# Patient Record
Sex: Male | Born: 1990 | Race: Black or African American | Hispanic: No | Marital: Single | Smoking: Never smoker
Health system: Southern US, Community
[De-identification: ages and names within clinical notes are randomized; demographics above are authoritative.]

## PROBLEM LIST (undated history)

## (undated) DIAGNOSIS — S62609A Fracture of unspecified phalanx of unspecified finger, initial encounter for closed fracture: Secondary | ICD-10-CM

---
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
## 2009-06-03 ENCOUNTER — Emergency Department (HOSPITAL_COMMUNITY): Admission: EM | Admit: 2009-06-03 | Discharge: 2009-06-03 | Payer: Self-pay | Admitting: Emergency Medicine

## 2010-11-26 NOTE — Consult Note (Signed)
[name], YIELDING              ACCOUNT NO.:  [id number]   MEDICAL RECORD NO.:  [id number]          PATIENT TYPE:  EMS   LOCATION:  MAJO                         FACILITY:  [facility]   PHYSICIAN:  Vanita Panda. Magnus Ivan, M.D.DATE OF BIRTH:  06-18-1991   DATE OF CONSULTATION:  12/10/2006  DATE OF DISCHARGE:  12/10/2006                                 CONSULTATION   REASON FOR CONSULTATION:  Right thumb, open fracture dislocation.   HISTORY OF PRESENT ILLNESS:  Briefly, Darrell Dougherty is a 20 year old right-  hand-dominant male who actually slammed his right dominant thumb into a  locker today.  He had obvious injury to his thumb.  He was brought by  his family members to the pediatric ER at [facility].  He is found to  have an open fracture dislocation of the right thumb at the IP joint,  and orthopedic surgery was consulted.  He denied any numbness and  tingling in the tip of the thumb, and otherwise was doing well.   PAST MEDICAL HISTORY:  Negative.   ALLERGIES:  NO KNOWN DRUG ALLERGIES.   MEDICATIONS:  None.   SOCIAL HISTORY:  He is a high [employer] and is right-hand dominant.  He has had a previous injury to his little finger.   REVIEW OF SYSTEMS:  Negative for chest pain, short of breath, fever,  chills, nausea, vomiting.   PHYSICAL EXAM:  VITAL SIGNS:  He is afebrile.  Stable vital signs.  GENERAL:  Alert and oriented male in no acute distress but obvious  discomfort.  RIGHT HAND:  Shows his thumb with obvious deformity of the IP joint,  with an open exposed proximal phalanx on the volar radial aspect.  His  thumb is dislocated at the IP joint dorsally and ulnarly.  The tip is a  well-perfused, and according to the staff, it had normal sensation prior  to them performing a digital block.  X-rays do show a right thumb  fracture dislocation of the IP joint in a dorsal/ulnar position, with  fracture of the proximal phalanx at the joint on the ulnar aspect.   IMPRESSION:   This is a 20 year old right-hand-dominant male with a right  thumb interphalangeal joint fracture dislocation.   PLAN:  In the emergency room, I was able to supplement the block with a  plain Marcaine digital block.  I cleaned the wound thoroughly with a  Betadine and normal saline solution, lavaging it through the open wound.  He was given a dose of IV Ancef.  Closed reduction with manipulation and  splinting was performed.  Post-reduction films showed completely reduced  position of the thumb.  The tip remain well-perfused.  He is going to be  discharged on p.o. antibiotics, as well as pain medication, and surgery  will be set up for pinning of the thumb IP joint and the  proximal phalanx fracture, during regular outpatient daytime hours next  week.  The risks and benefits:  There has been an understanding by him,  and I have talked to his mom on the phone, who understands this as well,  and  we will proceed with that next week.      Vanita Panda. Magnus Ivan, M.D.  Electronically Signed     CYB/MEDQ  D:  12/10/2006  T:  12/10/2006  Job:  [id number]

## 2010-11-26 NOTE — Op Note (Signed)
Darrell Dougherty, Darrell Dougherty              ACCOUNT NO.:  [id number]   MEDICAL RECORD NO.:  [id number]          PATIENT TYPE:  AMB   LOCATION:  SDS                          FACILITY:  [facility]   PHYSICIAN:  Vanita Panda. Magnus Ivan, M.D.DATE OF BIRTH:  [birth year]   DATE OF PROCEDURE:  12/15/2006  DATE OF DISCHARGE:  12/15/2006                               OPERATIVE REPORT   PREOPERATIVE DIAGNOSIS:  Right thumb interphalangeal joint fracture-  dislocation with proximal phalanx fracture.   POSTOPERATIVE DIAGNOSIS:  Right thumb interphalangeal joint fracture-  dislocation with proximal phalanx fracture.   PROCEDURE:  Closed reduction and percutaneous pinning of right thumb PIP  joint fracture dislocation.   SURGEON:  Doneen Poisson, MD   ANESTHESIA:  General.   ANTIBIOTICS:  One gram of IV Ancef.   BLOOD LOSS:  Minimal.   COMPLICATIONS:  None.   INDICATIONS:  Briefly, Darrell Dougherty is a 20 year old right-hand dominant male  who last Thursday sustained an open fracture-dislocation of his right  thumb at the PIP joint.  There was a proximal phalanx fracture of the  ulnar condyle of the proximal phalanx.  The open part was radial.  I saw  him in the emergency room, washed the wound well, did a digital block,  and was able to reduce the thumb into near anatomic position.  I placed  this in a splint and then set him up for definitive treatment for today.  The risks, benefits of surgery have been explained to him and his mother  and were well understood and they agreed to with surgery.   PROCEDURE:  After informed consent was obtained, the appropriate right  hand was marked.  He was brought to the operating room, placed supine on  the operating table.  General anesthesia was obtained.  His right hand  was prepped with Betadine scrub and then DuraPrep.  Using the  fluoroscopic C-arm with its base at the table, I was able to see the  thumb in its alignment.  There was certainly an unstable  aspect to the  fracture and the joint.  I took a 0.035 K-wire and ran it in a  retrograde fashion from the tip of the distal phalanx crossing the thumb  IP joint to hold this in a reduced position.  I next did a separate  0.035 K-wire in an oblique direction capturing the fracture piece in an  anatomic position to hold the condyle fracture into place.  The pins  were cut outside the skin and pin caps were placed.  The small open  wound from previously was cleaned again.  Xeroform was placed back over  this.  The thumb remained perfused throughout the case.   Following this, a well-padded sterile dressing was applied and pin caps  were placed.  I placed a well-padded plaster splint over the pin caps  for protection.  The patient was awakened, extubated, and taken to the  recovery room in stable condition.  Postoperatively, he will follow up  in the next three to five days and I will change his dressing and splint  and then likely the pins  in three to four weeks before removing these to  begin range of motion of the thumb.      Vanita Panda. Magnus Ivan, M.D.  Electronically Signed     CYB/MEDQ  D:  12/15/2006  T:  12/16/2006  Job:  [id number]

## 2011-05-01 LAB — CBC
HCT: 45.5
Hemoglobin: 14.4
MCHC: 31.7
RDW: 14.3 — ABNORMAL HIGH

## 2012-05-14 DEATH — deceased

## 2012-06-08 ENCOUNTER — Emergency Department (HOSPITAL_COMMUNITY): Payer: Self-pay

## 2012-06-08 ENCOUNTER — Encounter (HOSPITAL_COMMUNITY): Payer: Self-pay | Admitting: Emergency Medicine

## 2012-06-08 ENCOUNTER — Emergency Department (HOSPITAL_COMMUNITY)
Admission: EM | Admit: 2012-06-08 | Discharge: 2012-06-08 | Disposition: A | Discharge status: Home / Self Care | Payer: Self-pay | Attending: Emergency Medicine | Admitting: Emergency Medicine

## 2012-06-08 DIAGNOSIS — Y9389 Activity, other specified: Secondary | ICD-10-CM | POA: Insufficient documentation

## 2012-06-08 DIAGNOSIS — S62339A Displaced fracture of neck of unspecified metacarpal bone, initial encounter for closed fracture: Secondary | ICD-10-CM | POA: Insufficient documentation

## 2012-06-08 DIAGNOSIS — Y929 Unspecified place or not applicable: Secondary | ICD-10-CM | POA: Insufficient documentation

## 2012-06-08 DIAGNOSIS — IMO0002 Reserved for concepts with insufficient information to code with codable children: Secondary | ICD-10-CM | POA: Insufficient documentation

## 2012-06-08 MED ORDER — HYDROCODONE-ACETAMINOPHEN 5-325 MG PO TABS
1.0000 | ORAL_TABLET | Freq: Once | ORAL | Status: AC
  Administered 2012-06-08: 1 via ORAL
  Filled 2012-06-08: qty 1

## 2012-06-08 MED ORDER — OXYCODONE-ACETAMINOPHEN 5-325 MG PO TABS: 1.0000 | ORAL_TABLET | ORAL | Status: DC | PRN

## 2012-06-08 NOTE — Progress Notes (Signed)
Orthopedic Tech Progress Note Patient Details:  Darrell Dougherty 25-May-1991 [id number]  Ortho Devices Type of Ortho Device: Ulna gutter splint Ortho Device/Splint Location: LEFT UNNA GUTTER  SPLINT Ortho Device/Splint Interventions: Application   Cammer, Mickie Bail 06/08/2012, 3:48 PM

## 2012-06-08 NOTE — ED Notes (Signed)
Pt c/o left pinky injury after punching glass yesterday; bruising noted

## 2012-06-08 NOTE — ED Notes (Signed)
Pt given ice to apply to site.

## 2012-06-08 NOTE — ED Notes (Signed)
Pt c/o 9/10 throbbing pain to left hand, states he cannot move pinky finger to left hand. Pt A&Ox4, ambulatory, nad.

## 2012-06-08 NOTE — ED Provider Notes (Signed)
History  Scribed for Carleene Cooper III, MD, the patient was seen in room TR05C/TR05C. This chart was scribed by Candelaria Stagers. The patient's care started at 1:26 PM   CSN: [id number]  Arrival date & time 06/08/12  1217   First MD Initiated Contact with Patient 06/08/12 1239      Chief Complaint  Patient presents with  . Finger Injury     The history is provided by the patient. No language interpreter was used.   Darrell Dougherty is a 21 y.o. male who presents to the Emergency Department complaining of pain to his left pinky after punching glass earlier today.  He has no other injuries.  Bending his finger makes the pain worse.  He has taken nothing for the pain.   History reviewed. No pertinent past medical history.  History reviewed. No pertinent past surgical history.  History reviewed. No pertinent family history.  History  Substance Use Topics  . Smoking status: Never Smoker   . Smokeless tobacco: Not on file  . Alcohol Use: Yes      Review of Systems  All other systems reviewed and are negative.    Allergies  Review of patient's allergies indicates no known allergies.  Home Medications  No current outpatient prescriptions on file.  BP 128/70  Pulse 77  Temp 98 F (36.7 C) (Oral)  Resp 18  SpO2 100%  Physical Exam  Nursing note and vitals reviewed. Constitutional: He is oriented to person, place, and time. He appears well-developed and well-nourished. No distress.  HENT:  Head: Normocephalic and atraumatic.  Eyes: EOM are normal.  Neck: Neck supple. No tracheal deviation present.  Cardiovascular: Normal rate.   Pulmonary/Chest: Effort normal. No respiratory distress.  Musculoskeletal: Normal range of motion.       Swelling over the distal left 5th metatarsal.  Intact sensation and tendon function.  No skin injury.      Neurological: He is alert and oriented to person, place, and time.  Skin: Skin is warm and dry.  Psychiatric: He has a normal  mood and affect. His behavior is normal.    ED Course  Procedures  DIAGNOSTIC STUDIES: Oxygen Saturation is 100% on room air, normal by my interpretation.    COORDINATION OF CARE:   13:31 Ordered: DG Hand Complete Left  Labs Reviewed - No data to display Dg Hand Complete Left  06/08/2012  *RADIOLOGY REPORT*  Clinical Data: Finger injury  LEFT HAND - COMPLETE 3+ VIEW  Comparison: None.  Findings: Three views of the left hand submitted.  There is displaced fracture distal aspect left fifth metacarpal.  IMPRESSION: Displaced fracture of distal aspect left fifth metacarpal.   Original Report Authenticated By: Natasha Mead, M.D.     3:05 PM Call to Dr. Dionicio Stall office.  Pt is to call in to the office this afternoon to get a time to be seen tomorrow.  Rx Percocet for pain, ulnar gutter splint to left hand.  1. Boxers fracture     I personally performed the services described in this documentation, which was scribed in my presence. The recorded information has been reviewed and is accurate. Osvaldo Human, MD      Carleene Cooper III, MD 06/08/12 [phone]

## 2012-06-09 ENCOUNTER — Other Ambulatory Visit: Payer: Self-pay | Admitting: Orthopedic Surgery

## 2012-06-09 ENCOUNTER — Encounter (HOSPITAL_BASED_OUTPATIENT_CLINIC_OR_DEPARTMENT_OTHER): Payer: Self-pay | Admitting: *Deleted

## 2012-06-13 DIAGNOSIS — S62609A Fracture of unspecified phalanx of unspecified finger, initial encounter for closed fracture: Secondary | ICD-10-CM

## 2012-06-13 HISTORY — DX: Fracture of unspecified phalanx of unspecified finger, initial encounter for closed fracture: S62.609A

## 2012-06-14 ENCOUNTER — Other Ambulatory Visit: Payer: Self-pay | Admitting: Orthopedic Surgery

## 2012-06-14 ENCOUNTER — Encounter (HOSPITAL_BASED_OUTPATIENT_CLINIC_OR_DEPARTMENT_OTHER): Payer: Self-pay | Admitting: Anesthesiology

## 2012-06-14 ENCOUNTER — Encounter (HOSPITAL_BASED_OUTPATIENT_CLINIC_OR_DEPARTMENT_OTHER): Payer: Self-pay | Admitting: *Deleted

## 2012-06-14 ENCOUNTER — Encounter (HOSPITAL_BASED_OUTPATIENT_CLINIC_OR_DEPARTMENT_OTHER): Payer: Self-pay | Admitting: Orthopedic Surgery

## 2012-06-14 ENCOUNTER — Encounter (HOSPITAL_BASED_OUTPATIENT_CLINIC_OR_DEPARTMENT_OTHER): Admission: RE | Payer: Self-pay | Source: Ambulatory Visit

## 2012-06-14 ENCOUNTER — Ambulatory Visit (HOSPITAL_BASED_OUTPATIENT_CLINIC_OR_DEPARTMENT_OTHER): Admission: RE | Admit: 2012-06-14 | Payer: Self-pay | Source: Ambulatory Visit | Admitting: Orthopedic Surgery

## 2012-06-14 SURGERY — CLOSED REDUCTION, FRACTURE, METACARPAL BONE, WITH PERCUTANEOUS PINNING
Anesthesia: General | Site: Finger | Laterality: Left

## 2012-06-14 NOTE — H&P (Signed)
  Darrell Dougherty is an 21 y.o. male.    Patient was scheduled for surgical fixation, but did not present to the surgery center for care on the scheduled date.   Ares Cardozo R 06/14/2012, 10:19 AM

## 2012-06-15 ENCOUNTER — Ambulatory Visit (HOSPITAL_BASED_OUTPATIENT_CLINIC_OR_DEPARTMENT_OTHER): Payer: Self-pay | Admitting: Anesthesiology

## 2012-06-15 ENCOUNTER — Encounter (HOSPITAL_BASED_OUTPATIENT_CLINIC_OR_DEPARTMENT_OTHER): Payer: Self-pay

## 2012-06-15 ENCOUNTER — Encounter (HOSPITAL_BASED_OUTPATIENT_CLINIC_OR_DEPARTMENT_OTHER): Payer: Self-pay | Admitting: Anesthesiology

## 2012-06-15 ENCOUNTER — Encounter (HOSPITAL_BASED_OUTPATIENT_CLINIC_OR_DEPARTMENT_OTHER): Payer: Self-pay | Admitting: Orthopedic Surgery

## 2012-06-15 ENCOUNTER — Ambulatory Visit (HOSPITAL_BASED_OUTPATIENT_CLINIC_OR_DEPARTMENT_OTHER)
Admission: RE | Admit: 2012-06-15 | Discharge: 2012-06-15 | Disposition: A | Discharge status: Home / Self Care | Payer: Self-pay | Source: Ambulatory Visit | Attending: Orthopedic Surgery | Admitting: Orthopedic Surgery

## 2012-06-15 ENCOUNTER — Encounter (HOSPITAL_BASED_OUTPATIENT_CLINIC_OR_DEPARTMENT_OTHER)
Admission: RE | Disposition: A | Discharge status: Home / Self Care | Payer: Self-pay | Source: Ambulatory Visit | Attending: Orthopedic Surgery

## 2012-06-15 DIAGNOSIS — W2209XA Striking against other stationary object, initial encounter: Secondary | ICD-10-CM | POA: Insufficient documentation

## 2012-06-15 DIAGNOSIS — S62339A Displaced fracture of neck of unspecified metacarpal bone, initial encounter for closed fracture: Secondary | ICD-10-CM | POA: Insufficient documentation

## 2012-06-15 HISTORY — DX: Fracture of unspecified phalanx of unspecified finger, initial encounter for closed fracture: S62.609A

## 2012-06-15 HISTORY — PX: CLOSED REDUCTION FINGER WITH PERCUTANEOUS PINNING: SHX5612

## 2012-06-15 SURGERY — CLOSED REDUCTION, FINGER, WITH PERCUTANEOUS PINNING
Anesthesia: General | Site: Finger | Laterality: Left | Wound class: Clean

## 2012-06-15 MED ORDER — CHLORHEXIDINE GLUCONATE 4 % EX LIQD: 60.0000 mL | Freq: Once | CUTANEOUS | Status: DC

## 2012-06-15 MED ORDER — HYDROMORPHONE HCL PF 1 MG/ML IJ SOLN
0.2500 mg | INTRAMUSCULAR | Status: DC | PRN
  Administered 2012-06-15 (×3): 0.5 mg via INTRAVENOUS

## 2012-06-15 MED ORDER — ONDANSETRON HCL 4 MG/2ML IJ SOLN
INTRAMUSCULAR | Status: DC | PRN
  Administered 2012-06-15: 4 mg via INTRAVENOUS

## 2012-06-15 MED ORDER — DEXAMETHASONE SODIUM PHOSPHATE 4 MG/ML IJ SOLN
INTRAMUSCULAR | Status: DC | PRN
  Administered 2012-06-15: 10 mg via INTRAVENOUS

## 2012-06-15 MED ORDER — ACETAMINOPHEN 10 MG/ML IV SOLN
1000.0000 mg | Freq: Four times a day (QID) | INTRAVENOUS | Status: DC
  Administered 2012-06-15: 1000 mg via INTRAVENOUS

## 2012-06-15 MED ORDER — LIDOCAINE HCL (CARDIAC) 20 MG/ML IV SOLN
INTRAVENOUS | Status: DC | PRN
  Administered 2012-06-15: 100 mg via INTRAVENOUS

## 2012-06-15 MED ORDER — OXYCODONE HCL 5 MG/5ML PO SOLN: 5.0000 mg | Freq: Once | ORAL | Status: AC | PRN

## 2012-06-15 MED ORDER — OXYCODONE HCL 5 MG PO TABS
5.0000 mg | ORAL_TABLET | Freq: Once | ORAL | Status: AC | PRN
  Administered 2012-06-15: 5 mg via ORAL

## 2012-06-15 MED ORDER — CEFAZOLIN SODIUM-DEXTROSE 2-3 GM-% IV SOLR
2.0000 g | Freq: Once | INTRAVENOUS | Status: AC
  Administered 2012-06-15: 2 g via INTRAVENOUS

## 2012-06-15 MED ORDER — LACTATED RINGERS IV SOLN
INTRAVENOUS | Status: DC
  Administered 2012-06-15 (×2): via INTRAVENOUS

## 2012-06-15 MED ORDER — HYDROCODONE-ACETAMINOPHEN 5-325 MG PO TABS: ORAL_TABLET | ORAL | Status: DC

## 2012-06-15 MED ORDER — FENTANYL CITRATE 0.05 MG/ML IJ SOLN
INTRAMUSCULAR | Status: DC | PRN
  Administered 2012-06-15: 100 ug via INTRAVENOUS

## 2012-06-15 MED ORDER — MIDAZOLAM HCL 5 MG/5ML IJ SOLN
INTRAMUSCULAR | Status: DC | PRN
  Administered 2012-06-15: 2 mg via INTRAVENOUS

## 2012-06-15 MED ORDER — PROPOFOL 10 MG/ML IV BOLUS
INTRAVENOUS | Status: DC | PRN
  Administered 2012-06-15: 150 mg via INTRAVENOUS

## 2012-06-15 MED ORDER — BUPIVACAINE HCL (PF) 0.25 % IJ SOLN
INTRAMUSCULAR | Status: DC | PRN
  Administered 2012-06-15: 6 mL

## 2012-06-15 SURGICAL SUPPLY — 56 items
BANDAGE ELASTIC 3 VELCRO ST LF (GAUZE/BANDAGES/DRESSINGS) IMPLANT
BANDAGE GAUZE ELAST BULKY 4 IN (GAUZE/BANDAGES/DRESSINGS) ×3 IMPLANT
BLADE MINI RND TIP GREEN BEAV (BLADE) IMPLANT
BLADE SURG 15 STRL LF DISP TIS (BLADE) ×4 IMPLANT
BLADE SURG 15 STRL SS (BLADE) ×2
BNDG ELASTIC 2 VLCR STRL LF (GAUZE/BANDAGES/DRESSINGS) IMPLANT
BNDG ESMARK 4X9 LF (GAUZE/BANDAGES/DRESSINGS) ×3 IMPLANT
CHLORAPREP W/TINT 26ML (MISCELLANEOUS) ×3 IMPLANT
CLOTH BEACON ORANGE TIMEOUT ST (SAFETY) ×3 IMPLANT
CORDS BIPOLAR (ELECTRODE) ×3 IMPLANT
COVER MAYO STAND STRL (DRAPES) ×3 IMPLANT
COVER TABLE BACK 60X90 (DRAPES) ×3 IMPLANT
CUFF TOURNIQUET SINGLE 18IN (TOURNIQUET CUFF) ×3 IMPLANT
DRAPE EXTREMITY T 121X128X90 (DRAPE) ×3 IMPLANT
DRAPE OEC MINIVIEW 54X84 (DRAPES) ×3 IMPLANT
DRAPE SURG 17X23 STRL (DRAPES) ×3 IMPLANT
GAUZE XEROFORM 1X8 LF (GAUZE/BANDAGES/DRESSINGS) ×3 IMPLANT
GLOVE BIO SURGEON STRL SZ 6.5 (GLOVE) ×3 IMPLANT
GLOVE BIO SURGEON STRL SZ7.5 (GLOVE) ×3 IMPLANT
GLOVE BIOGEL PI IND STRL 8 (GLOVE) ×2 IMPLANT
GLOVE BIOGEL PI IND STRL 8.5 (GLOVE) IMPLANT
GLOVE BIOGEL PI INDICATOR 8 (GLOVE) ×1
GLOVE BIOGEL PI INDICATOR 8.5 (GLOVE)
GLOVE SURG ORTHO 8.0 STRL STRW (GLOVE) IMPLANT
GOWN PREVENTION PLUS XLARGE (GOWN DISPOSABLE) ×3 IMPLANT
GOWN PREVENTION PLUS XXLARGE (GOWN DISPOSABLE) ×3 IMPLANT
GOWN STRL REIN XL XLG (GOWN DISPOSABLE) ×3 IMPLANT
KWIRE 4.0 X .035IN (WIRE) ×6 IMPLANT
NEEDLE HYPO 22GX1.5 SAFETY (NEEDLE) IMPLANT
NEEDLE HYPO 25X1 1.5 SAFETY (NEEDLE) IMPLANT
NS IRRIG 1000ML POUR BTL (IV SOLUTION) ×3 IMPLANT
PACK BASIN DAY SURGERY FS (CUSTOM PROCEDURE TRAY) ×3 IMPLANT
PAD CAST 3X4 CTTN HI CHSV (CAST SUPPLIES) IMPLANT
PAD CAST 4YDX4 CTTN HI CHSV (CAST SUPPLIES) IMPLANT
PADDING CAST ABS 4INX4YD NS (CAST SUPPLIES) ×1
PADDING CAST ABS COTTON 4X4 ST (CAST SUPPLIES) ×2 IMPLANT
PADDING CAST COTTON 3X4 STRL (CAST SUPPLIES)
PADDING CAST COTTON 4X4 STRL (CAST SUPPLIES)
SLEEVE SCD COMPRESS KNEE MED (MISCELLANEOUS) IMPLANT
SPLINT PLASTER CAST XFAST 3X15 (CAST SUPPLIES) IMPLANT
SPLINT PLASTER CAST XFAST 4X15 (CAST SUPPLIES) IMPLANT
SPLINT PLASTER XTRA FAST SET 4 (CAST SUPPLIES)
SPLINT PLASTER XTRA FASTSET 3X (CAST SUPPLIES)
SPONGE GAUZE 4X4 12PLY (GAUZE/BANDAGES/DRESSINGS) ×3 IMPLANT
STOCKINETTE 4X48 STRL (DRAPES) ×3 IMPLANT
SUT ETHILON 3 0 PS 1 (SUTURE) IMPLANT
SUT ETHILON 4 0 PS 2 18 (SUTURE) ×3 IMPLANT
SUT MERSILENE 4 0 P 3 (SUTURE) IMPLANT
SUT VIC AB 3-0 PS1 18 (SUTURE)
SUT VIC AB 3-0 PS1 18XBRD (SUTURE) IMPLANT
SUT VICRYL 4-0 PS2 18IN ABS (SUTURE) IMPLANT
SYR BULB 3OZ (MISCELLANEOUS) ×3 IMPLANT
SYR CONTROL 10ML LL (SYRINGE) IMPLANT
TOWEL OR 17X24 6PK STRL BLUE (TOWEL DISPOSABLE) ×6 IMPLANT
UNDERPAD 30X30 INCONTINENT (UNDERPADS AND DIAPERS) ×3 IMPLANT
WATER STERILE IRR 1000ML POUR (IV SOLUTION) ×3 IMPLANT

## 2012-06-15 NOTE — Transfer of Care (Signed)
Immediate Anesthesia Transfer of Care Note  Patient: Darrell Dougherty  Procedure(s) Performed: Procedure(s) (LRB) with comments: CLOSED REDUCTION FINGER WITH PERCUTANEOUS PINNING (Left) - left small finger  Patient Location: PACU  Anesthesia Type:General  Level of Consciousness: sedated  Airway & Oxygen Therapy: Patient Spontanous Breathing and Patient connected to face mask oxygen  Post-op Assessment: Report given to PACU RN and Post -op Vital signs reviewed and stable  Post vital signs: Reviewed and stable  Complications: No apparent anesthesia complications

## 2012-06-15 NOTE — Op Note (Signed)
Dictation [phone]

## 2012-06-15 NOTE — Anesthesia Preprocedure Evaluation (Signed)
Anesthesia Evaluation  Patient identified by MRN, date of birth, ID band Patient awake    Reviewed: Allergy & Precautions, H&P , NPO status , Patient's Chart, lab work & pertinent test results  Airway Mallampati: I TM Distance: >3 FB Neck ROM: Full    Dental No notable dental hx. (+) Teeth Intact and Dental Advisory Given   Pulmonary neg pulmonary ROS,  breath sounds clear to auscultation  Pulmonary exam normal       Cardiovascular negative cardio ROS  Rhythm:Regular Rate:Normal     Neuro/Psych negative neurological ROS  negative psych ROS   GI/Hepatic negative GI ROS, Neg liver ROS,   Endo/Other  negative endocrine ROS  Renal/GU negative Renal ROS  negative genitourinary   Musculoskeletal   Abdominal   Peds  Hematology negative hematology ROS (+)   Anesthesia Other Findings   Reproductive/Obstetrics negative OB ROS                           Anesthesia Physical Anesthesia Plan  ASA: I  Anesthesia Plan: General   Post-op Pain Management:    Induction: Intravenous  Airway Management Planned: LMA  Additional Equipment:   Intra-op Plan:   Post-operative Plan: Extubation in OR  Informed Consent: I have reviewed the patients History and Physical, chart, labs and discussed the procedure including the risks, benefits and alternatives for the proposed anesthesia with the patient or authorized representative who has indicated his/her understanding and acceptance.   Dental advisory given  Plan Discussed with: CRNA  Anesthesia Plan Comments:         Anesthesia Quick Evaluation

## 2012-06-15 NOTE — Anesthesia Postprocedure Evaluation (Signed)
  Anesthesia Post-op Note  Patient: Darrell Dougherty  Procedure(s) Performed: Procedure(s) (LRB) with comments: CLOSED REDUCTION FINGER WITH PERCUTANEOUS PINNING (Left) - left small finger  Patient Location: PACU  Anesthesia Type:General  Level of Consciousness: awake  Airway and Oxygen Therapy: Patient Spontanous Breathing  Post-op Pain: mild  Post-op Assessment: Post-op Vital signs reviewed, Patient's Cardiovascular Status Stable, Respiratory Function Stable, Patent Airway and No signs of Nausea or vomiting  Post-op Vital Signs: Reviewed and stable  Complications: No apparent anesthesia complications

## 2012-06-15 NOTE — Op Note (Signed)
[name], [name]              ACCOUNT NO.:  [id number]  MEDICAL RECORD NO.:  [id number]  LOCATION:                                 FACILITY:  PHYSICIAN:  Betha Loa, MD        DATE OF BIRTH:  [birth year]  DATE OF PROCEDURE:  06/15/2012 DATE OF DISCHARGE:                              OPERATIVE REPORT   PREOPERATIVE DIAGNOSIS:  Left small finger metacarpal neck fracture.  POSTOPERATIVE DIAGNOSIS:  Left small finger metacarpal neck fracture.  PROCEDURE:  Closed reduction and percutaneous pinning, left small finger metacarpal neck fracture.  SURGEON:  Betha Loa, MD  ASSISTANT:  None.  ANESTHESIA:  General.  IV FLUIDS:  Per Anesthesia flow sheet.  ESTIMATED BLOOD LOSS:  Minimal.  COMPLICATIONS:  None.  SPECIMENS:  None.  TOURNIQUET TIME:  21 minutes.  DISPOSITION:  Stable to PACU.  INDICATIONS:  Darrell Dougherty is a 21 year old right-hand-dominant male who punched a car window a week ago.  He had pain in his hand.  He presented to the emergency department where radiographs were taken, revealing a small finger metacarpal neck fracture.  He was placed in a splint and followed up with me in the office.  On examination, he had a small amount of abduction of the small finger and weakness in full extension. We discussed nonoperative and operative treatment options.  He wished to proceed with operative treatment.  Risks, benefits, and alternatives of the surgery were discussed including the risk of blood loss, infection, damage to nerves, vessels, tendons, ligaments, bone; failure of surgery; need for additional surgery; complications with wound healing; continued pain; nonunion; malunion; stiffness.  He voiced understanding of these risks and elected to proceed.  OPERATIVE COURSE:  After being identified preoperatively by myself, the patient and I agreed upon the procedure and site of the procedure.  The surgical site was marked.  The risks, benefits, and alternatives  of surgery were reviewed and he wished to proceed.  Surgical consent had been signed.  He was given 2 g of IV Ancef as preoperative antibiotic prophylaxis.  He was transported to the operating room, placed on the operating room table in supine position with left upper extremity on arm board.  General anesthesia was induced by anesthesiologist.  Left upper extremity was prepped and draped in normal sterile orthopedic fashion. Surgical pause was performed between surgeons, Anesthesia, and operating staff, and all were in agreement as to the patient, procedure, and site of the procedure.  Tourniquet at the proximal aspect of the extremity was inflated to 250 mmHg mercury after exsanguination of the limb with an Esmarch bandage.  C-arm was used in AP, lateral, and oblique projections to aid in closed reduction of the fracture.  Near-anatomic reduction was obtained.  The wrist was placed through a tenodesis and there was no scissoring.  A 0.035-inch K-wire was then advanced from distally across the fracture site, and out proximally.  This was then pulled proximally, so that the pin exited the skin proximally.  Two additional pins were placed, so that there were 2 pins going to the radial side of the distal fragment and 1 pin on the ulnar side.  This was  adequate to stabilize the fracture.  The pins were bent and cut short.  The wound was injected with 6 mL of 0.25% plain Marcaine to aid in postoperative analgesia.  The pin sites were then dressed with sterile Xeroform, 4x4s, and wrapped with a Kerlix bandage.  A volar and dorsal slab splint including the long, ring, and small fingers was placed with the MPs flexed and IPs extended.  Wrist was wrapped with Kerlix and Ace bandage.  Tourniquet was deflated at 21 minutes. Fingertips were pink with brisk capillary refill after deflation of tourniquet.  Operative drapes were broken down.  The patient was awoken from anesthesia safely.  He was  transferred back to stretcher and taken to the PACU in stable condition.  I will see him back in the office in 1 week for postoperative followup.  I will give him Norco 5/325, 1-2 p.o. q.6 hours p.r.n. pain, dispensed #40.     Betha Loa, MD     KK/MEDQ  D:  06/15/2012  T:  06/15/2012  Job:  [id number]

## 2012-06-15 NOTE — Anesthesia Procedure Notes (Signed)
Procedure Name: LMA Insertion Date/Time: 06/15/2012 7:56 AM Performed by: Gar Gibbon Pre-anesthesia Checklist: Patient identified, Emergency Drugs available, Suction available and Patient being monitored Patient Re-evaluated:Patient Re-evaluated prior to inductionOxygen Delivery Method: Circle System Utilized Preoxygenation: Pre-oxygenation with 100% oxygen Intubation Type: IV induction Ventilation: Mask ventilation without difficulty LMA: LMA inserted LMA Size: 5.0 Number of attempts: 1 Airway Equipment and Method: bite block Placement Confirmation: positive ETCO2 Tube secured with: Tape Dental Injury: Teeth and Oropharynx as per pre-operative assessment

## 2012-06-15 NOTE — H&P (Signed)
Darrell Dougherty is an 21 y.o. male.   Chief Complaint: left small metacarpal fracture HPI: 21 yo rhd male punched car window 06/08/12. Seen at [facility] where XR revealed left small finger metacarpal neck fracture. Splinted and followed up in office. Reports no previous injury to left hand.    Past Medical History  Diagnosis Date  . Finger fracture, left 06/2012    left small finger    Past Surgical History  Procedure Date  . Closed reduction finger with percutaneous pinning 12/15/2006    right thumb PIP fx. dislocation    History reviewed. No pertinent family history. Social History:  reports that he has never smoked. He has never used smokeless tobacco. He reports that he drinks alcohol. He reports that he uses illicit drugs (Marijuana).  Allergies: No Known Allergies  Medications Prior to Admission  Medication Sig Dispense Refill  . oxyCODONE-acetaminophen (PERCOCET/ROXICET) 5-325 MG per tablet Take 1 tablet by mouth every 4 (four) hours as needed for pain.  20 tablet  0    Results for orders placed during the hospital encounter of 06/15/12 (from the past 48 hour(s))  POCT HEMOGLOBIN-HEMACUE     Status: Normal   Collection Time   06/15/12  6:55 AM      Component Value Range Comment   Hemoglobin 13.7  13.0 - 17.0 g/dL     No results found.   A comprehensive review of systems was negative except for: Neurological: positive for headaches  Blood pressure 125/78, pulse 70, temperature 97.9 F (36.6 C), temperature source Oral, resp. rate 20, height 5\' 10"  (1.778 m), weight 81.647 kg (180 lb), SpO2 100.00%.  General appearance: alert, cooperative and appears stated age Head: Normocephalic, without obvious abnormality, atraumatic Neck: supple, symmetrical, trachea midline Resp: clear to auscultation bilaterally Cardio: regular rate and rhythm GI: non tender Extremities: intact sensation and capillary refill all digits.  +epl/fpl/io Pulses: 2+ and symmetric Skin: Skin color,  texture, turgor normal. No rashes or lesions Neurologic: Grossly normal Incision/Wound: na  Assessment/Plan Left small finger metacarpal neck fracture. Non operative and operative treatment options were discussed with the patient and patient wishes to proceed with operative treatment. Risks, benefits, and alternatives of surgery were discussed and the patient agrees with the plan of care.    Darrell Dougherty R 06/15/2012, 7:47 AM

## 2012-06-16 ENCOUNTER — Encounter (HOSPITAL_BASED_OUTPATIENT_CLINIC_OR_DEPARTMENT_OTHER): Payer: Self-pay | Admitting: Orthopedic Surgery

## 2012-06-22 ENCOUNTER — Ambulatory Visit: Payer: Self-pay | Attending: Orthopedic Surgery | Admitting: *Deleted

## 2013-08-10 ENCOUNTER — Emergency Department (HOSPITAL_COMMUNITY)
Admission: EM | Admit: 2013-08-10 | Discharge: 2013-08-10 | Disposition: A | Discharge status: Home / Self Care | Payer: Self-pay | Attending: Emergency Medicine | Admitting: Emergency Medicine

## 2013-08-10 ENCOUNTER — Encounter (HOSPITAL_COMMUNITY): Payer: Self-pay | Admitting: Emergency Medicine

## 2013-08-10 DIAGNOSIS — Z8781 Personal history of (healed) traumatic fracture: Secondary | ICD-10-CM | POA: Insufficient documentation

## 2013-08-10 DIAGNOSIS — IMO0002 Reserved for concepts with insufficient information to code with codable children: Secondary | ICD-10-CM | POA: Insufficient documentation

## 2013-08-10 DIAGNOSIS — Y9389 Activity, other specified: Secondary | ICD-10-CM | POA: Insufficient documentation

## 2013-08-10 DIAGNOSIS — T07XXXA Unspecified multiple injuries, initial encounter: Secondary | ICD-10-CM

## 2013-08-10 DIAGNOSIS — Y929 Unspecified place or not applicable: Secondary | ICD-10-CM | POA: Insufficient documentation

## 2013-08-10 DIAGNOSIS — Z791 Long term (current) use of non-steroidal anti-inflammatories (NSAID): Secondary | ICD-10-CM | POA: Insufficient documentation

## 2013-08-10 MED ORDER — OXYCODONE-ACETAMINOPHEN 5-325 MG PO TABS
1.0000 | ORAL_TABLET | Freq: Once | ORAL | Status: AC
  Administered 2013-08-10: 1 via ORAL
  Filled 2013-08-10: qty 1

## 2013-08-10 MED ORDER — IBUPROFEN 800 MG PO TABS: 800.0000 mg | ORAL_TABLET | Freq: Three times a day (TID) | ORAL | Status: AC

## 2013-08-10 MED ORDER — OXYCODONE-ACETAMINOPHEN 5-325 MG PO TABS: 1.0000 | ORAL_TABLET | Freq: Four times a day (QID) | ORAL | Status: AC | PRN
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
## 2013-08-10 NOTE — Discharge Instructions (Signed)
Expect to be sore tomorrow and have new areas of bruising.  Keep abrasions clean and dry.  Wash with soap, water twice a day.  Apply clean Band-Aids or dressings to the area.  Return to the emergency room  for signs of infection: Redness, swelling, drainage of pus.   Abrasion An abrasion is a cut or scrape of the skin. Abrasions do not extend through all layers of the skin and most heal within 10 days. It is important to care for your abrasion properly to prevent infection. CAUSES  Most abrasions are caused by falling on, or gliding across, the ground or other surface. When your skin rubs on something, the outer and inner layer of skin rubs off, causing an abrasion. DIAGNOSIS  Your caregiver will be able to diagnose an abrasion during a physical exam.  TREATMENT  Your treatment depends on how large and deep the abrasion is. Generally, your abrasion will be cleaned with water and a mild soap to remove any dirt or debris. An antibiotic ointment may be put over the abrasion to prevent an infection. A bandage (dressing) may be wrapped around the abrasion to keep it from getting dirty.  You may need a tetanus shot if:  You cannot remember when you had your last tetanus shot.  You have never had a tetanus shot.  The injury broke your skin. If you get a tetanus shot, your arm may swell, get red, and feel warm to the touch. This is common and not a problem. If you need a tetanus shot and you choose not to have one, there is a rare chance of getting tetanus. Sickness from tetanus can be serious.  HOME CARE INSTRUCTIONS   If a dressing was applied, change it at least once a day or as directed by your caregiver. If the bandage sticks, soak it off with warm water.   Wash the area with water and a mild soap to remove all the ointment 2 times a day. Rinse off the soap and pat the area dry with a clean towel.   Reapply any ointment as directed by your caregiver. This will help prevent infection and keep  the bandage from sticking. Use gauze over the wound and under the dressing to help keep the bandage from sticking.   Change your dressing right away if it becomes wet or dirty.   Only take over-the-counter or prescription medicines for pain, discomfort, or fever as directed by your caregiver.   Follow up with your caregiver within 24 48 hours for a wound check, or as directed. If you were not given a wound-check appointment, look closely at your abrasion for redness, swelling, or pus. These are signs of infection. SEEK IMMEDIATE MEDICAL CARE IF:   You have increasing pain in the wound.   You have redness, swelling, or tenderness around the wound.   You have pus coming from the wound.   You have a fever or persistent symptoms for more than 2 3 days.  You have a fever and your symptoms suddenly get worse.  You have a bad smell coming from the wound or dressing.  MAKE SURE YOU:   Understand these instructions.  Will watch your condition.  Will get help right away if you are not doing well or get worse. Document Released: 04/09/2005 Document Revised: 06/16/2012 Document Reviewed: 06/03/2011 [facility] Patient Information 2014 [place], [place].  Contusion A contusion is a deep bruise. Contusions are the result of an injury that caused bleeding under the skin.  The contusion may turn blue, purple, or yellow. Minor injuries will give you a painless contusion, but more severe contusions may stay painful and swollen for a few weeks.  CAUSES  A contusion is usually caused by a blow, trauma, or direct force to an area of the body. SYMPTOMS   Swelling and redness of the injured area.  Bruising of the injured area.  Tenderness and soreness of the injured area.  Pain. DIAGNOSIS  The diagnosis can be made by taking a history and physical exam. An X-ray, CT scan, or MRI may be needed to determine if there were any associated injuries, such as fractures. TREATMENT  Specific treatment  will depend on what area of the body was injured. In general, the best treatment for a contusion is resting, icing, elevating, and applying cold compresses to the injured area. Over-the-counter medicines may also be recommended for pain control. Ask your caregiver what the best treatment is for your contusion. HOME CARE INSTRUCTIONS   Put ice on the injured area.  Put ice in a plastic bag.  Place a towel between your skin and the bag.  Leave the ice on for 15-20 minutes, 03-04 times a day.  Only take over-the-counter or prescription medicines for pain, discomfort, or fever as directed by your caregiver. Your caregiver may recommend avoiding anti-inflammatory medicines (aspirin, ibuprofen, and naproxen) for 48 hours because these medicines may increase bruising.  Rest the injured area.  If possible, elevate the injured area to reduce swelling. SEEK IMMEDIATE MEDICAL CARE IF:   You have increased bruising or swelling.  You have pain that is getting worse.  Your swelling or pain is not relieved with medicines. MAKE SURE YOU:   Understand these instructions.  Will watch your condition.  Will get help right away if you are not doing well or get worse. Document Released: 04/09/2005 Document Revised: 09/22/2011 Document Reviewed: 05/05/2011 [facility] Patient Information 2014 [place], [place].

## 2013-08-10 NOTE — ED Notes (Signed)
Patient has ride home with friend 

## 2014-07-19 IMAGING — CR DG HAND COMPLETE 3+V*L*
3 series · 3 of 3 positions shown · non-contrast
Comparison: None.

CLINICAL DATA: Finger injury

LEFT HAND - COMPLETE 3+ VIEW

[x hand pa left]
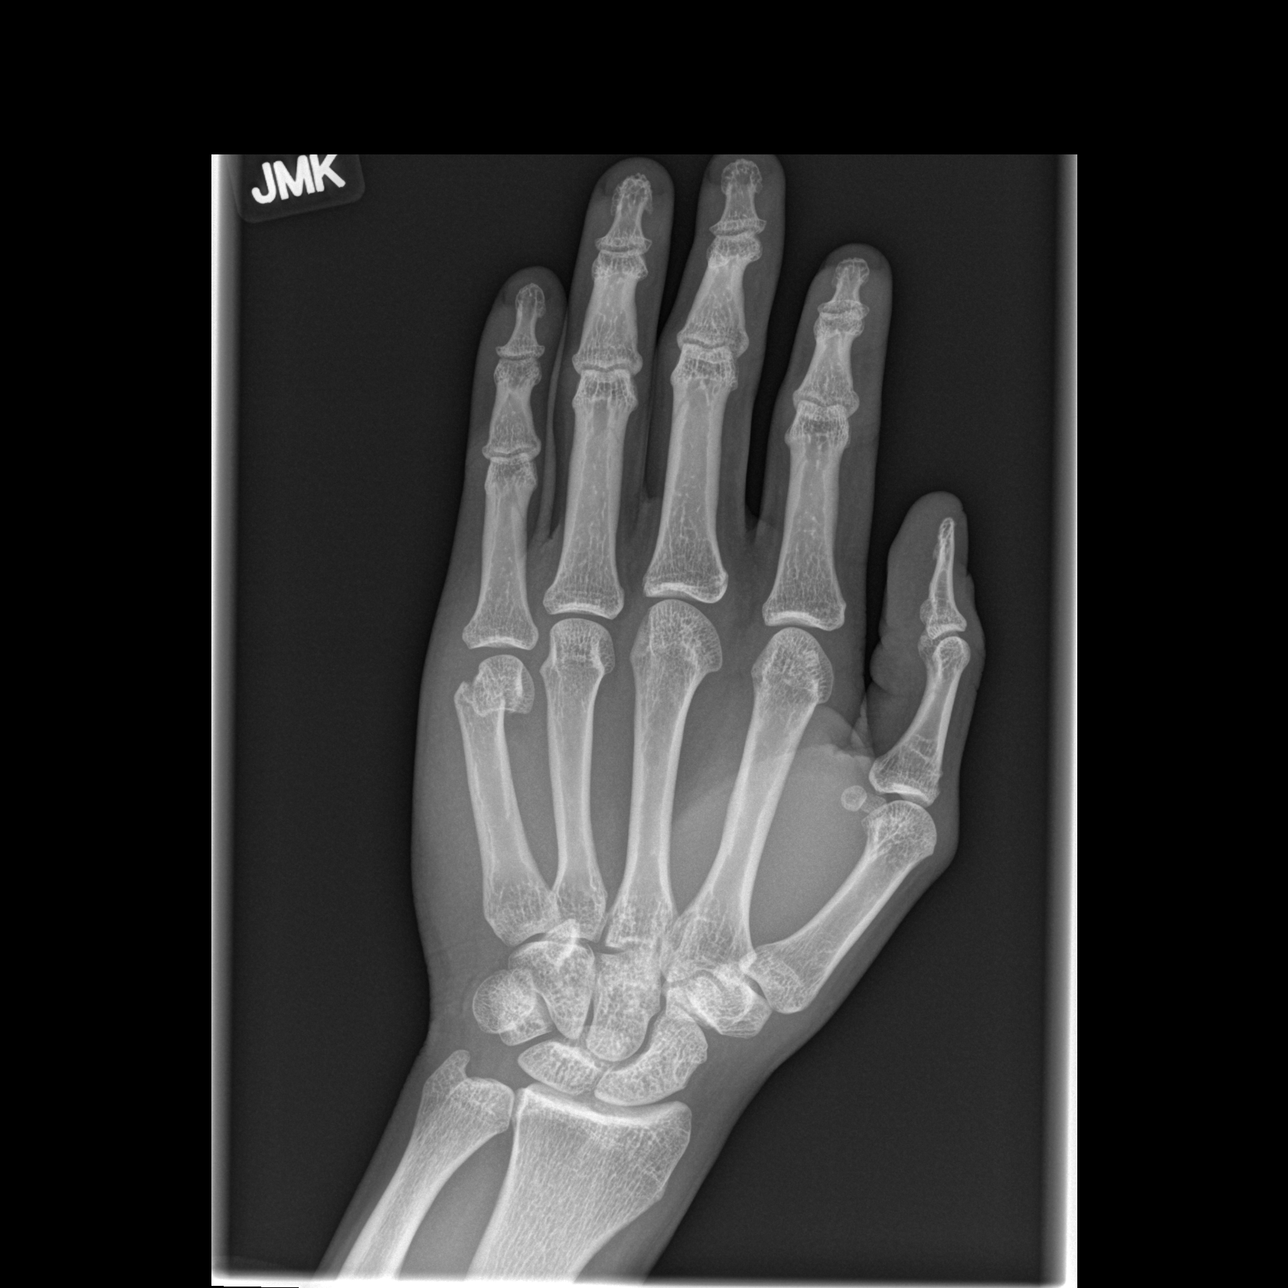

[x hand oblique left]
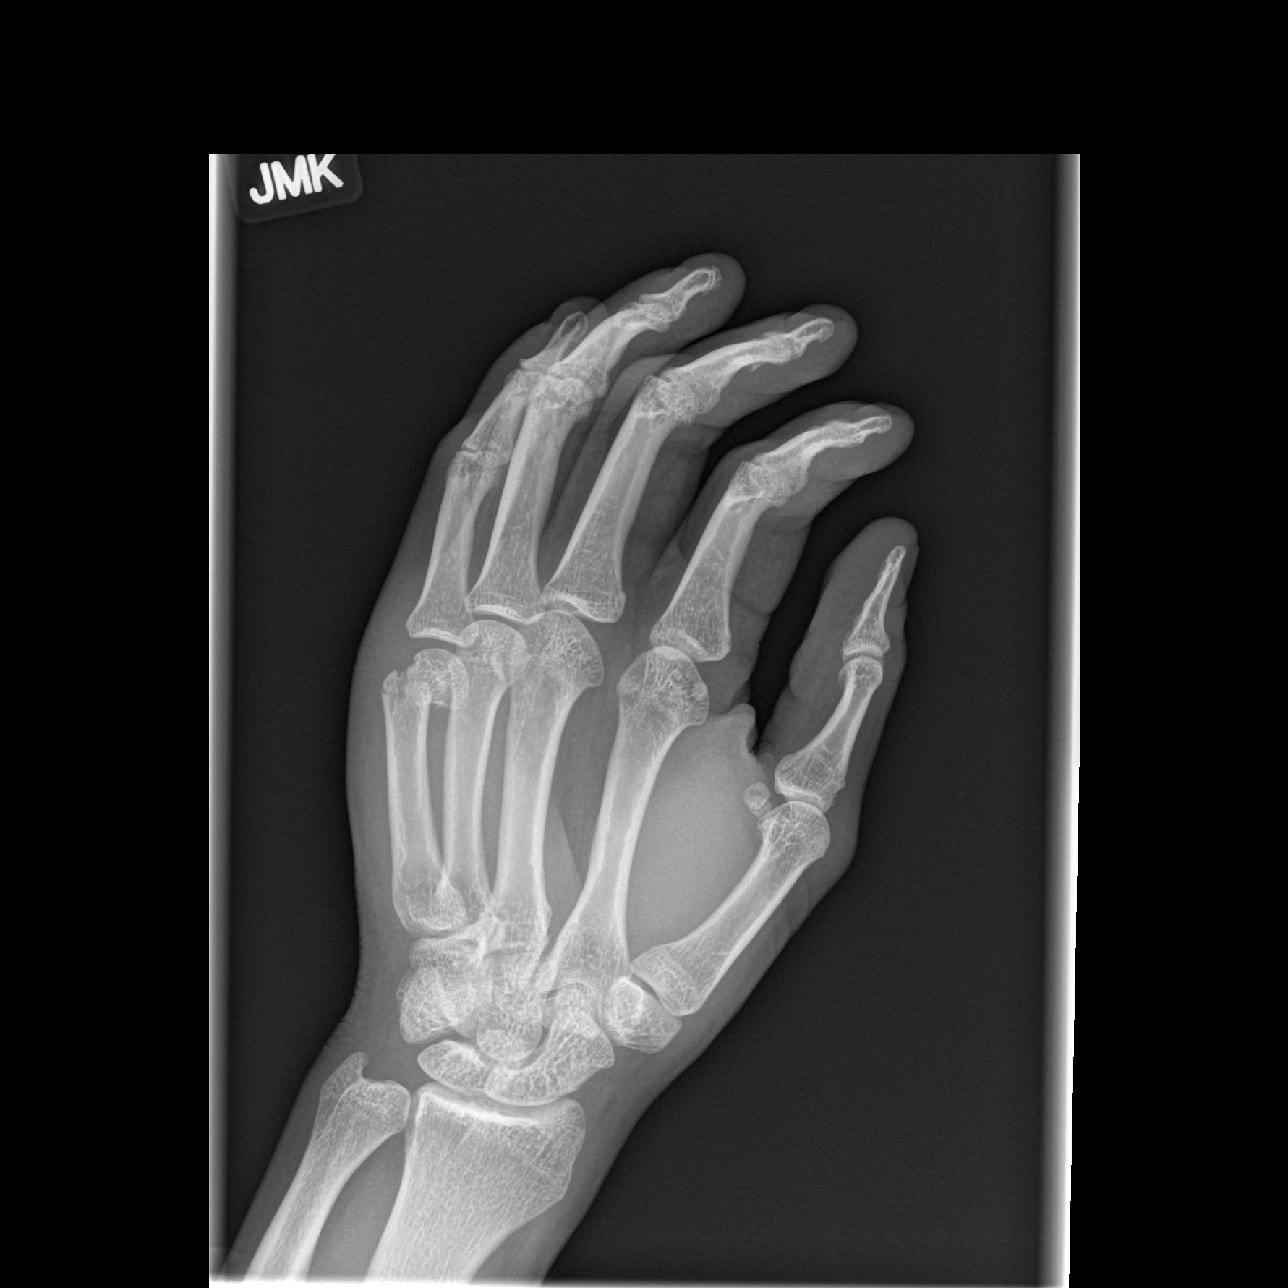

[x hand lat left]
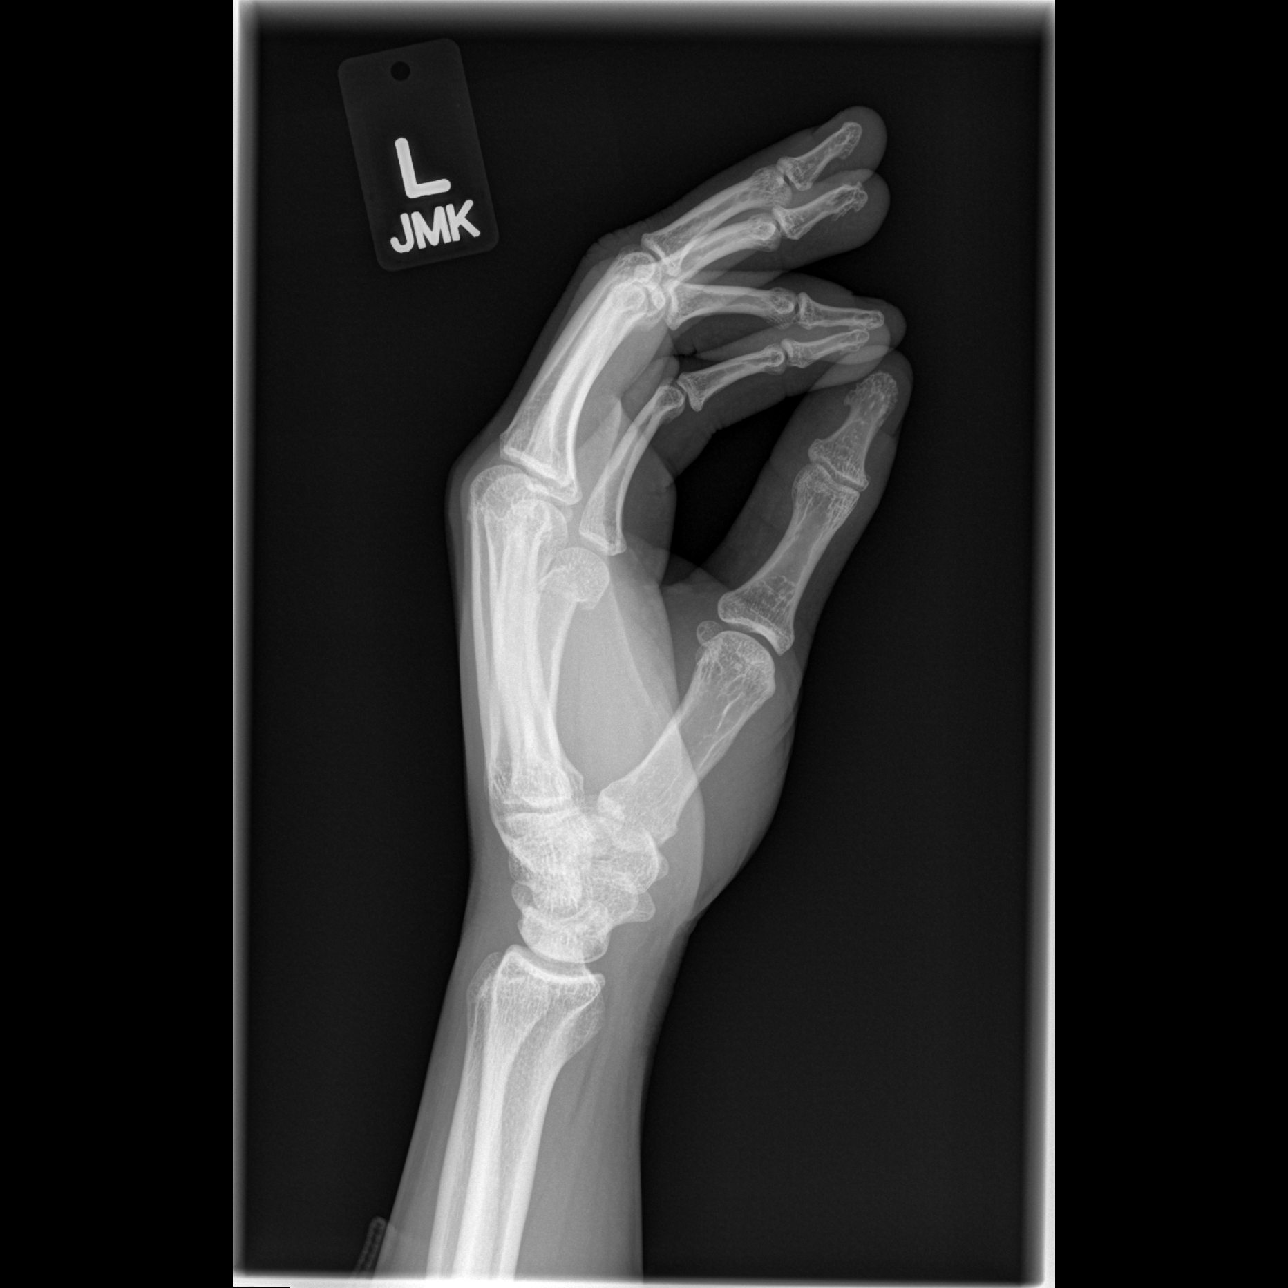

[3 of 3 positions shown; findings below may reference images not displayed]

FINDINGS: Three views of the left hand submitted.  There is
displaced fracture distal aspect left fifth metacarpal.
IMPRESSION: Displaced fracture of distal aspect left fifth metacarpal.

## 2015-04-30 ENCOUNTER — Emergency Department (HOSPITAL_COMMUNITY)
Admission: EM | Admit: 2015-04-30 | Discharge: 2015-04-30 | Discharge status: Against Medical Advice | Payer: Medicaid Other | Attending: Emergency Medicine | Admitting: Emergency Medicine

## 2015-04-30 ENCOUNTER — Encounter (HOSPITAL_COMMUNITY): Payer: Self-pay | Admitting: Family Medicine

## 2015-04-30 DIAGNOSIS — G43909 Migraine, unspecified, not intractable, without status migrainosus: Secondary | ICD-10-CM | POA: Diagnosis present

## 2015-04-30 DIAGNOSIS — R51 Headache: Secondary | ICD-10-CM

## 2015-04-30 DIAGNOSIS — R519 Headache, unspecified: Secondary | ICD-10-CM

## 2015-04-30 NOTE — ED Notes (Signed)
Pt here for intermittent headaches. sts his vision gets blurry, and sensitive to light.

## 2017-03-05 ENCOUNTER — Encounter (HOSPITAL_COMMUNITY): Payer: Self-pay

## 2017-03-05 ENCOUNTER — Emergency Department (HOSPITAL_COMMUNITY): Payer: Medicaid Other

## 2017-03-05 DIAGNOSIS — Y929 Unspecified place or not applicable: Secondary | ICD-10-CM | POA: Insufficient documentation

## 2017-03-05 DIAGNOSIS — W230XXA Caught, crushed, jammed, or pinched between moving objects, initial encounter: Secondary | ICD-10-CM | POA: Diagnosis not present

## 2017-03-05 DIAGNOSIS — S99921A Unspecified injury of right foot, initial encounter: Secondary | ICD-10-CM | POA: Diagnosis present

## 2017-03-05 DIAGNOSIS — Y939 Activity, unspecified: Secondary | ICD-10-CM | POA: Insufficient documentation

## 2017-03-05 DIAGNOSIS — S9031XA Contusion of right foot, initial encounter: Secondary | ICD-10-CM | POA: Diagnosis not present

## 2017-03-05 DIAGNOSIS — Y998 Other external cause status: Secondary | ICD-10-CM | POA: Insufficient documentation

## 2017-03-05 NOTE — ED Triage Notes (Signed)
Pt endorses shutting a door on his right foot by accident. Pt complains of pain to same area. Good pedal pulse and cms intact. Unable to bear weight on foot. VSS.

## 2017-03-06 ENCOUNTER — Emergency Department (HOSPITAL_COMMUNITY)
Admission: EM | Admit: 2017-03-06 | Discharge: 2017-03-06 | Disposition: A | Discharge status: Home / Self Care | Payer: Medicaid Other | Attending: Physician Assistant | Admitting: Physician Assistant

## 2017-03-06 DIAGNOSIS — S9031XA Contusion of right foot, initial encounter: Secondary | ICD-10-CM

## 2017-03-06 NOTE — Discharge Instructions (Signed)
If not feeling better or feeling numbness tingling or weakness please return and follow-up with the orthopedic surgeon.

## 2017-03-06 NOTE — ED Notes (Signed)
Pt ankle wrapped with ace wrap- and pt instructed proper use of crutches

## 2017-03-06 NOTE — ED Provider Notes (Signed)
[facility]-EMERGENCY DEPT Provider Note   CSN: [id number] Arrival date & time: 03/05/17  2222     History   Chief Complaint Chief Complaint  Patient presents with  . Foot Injury    HPI Darrell Dougherty is a 26 y.o. male.  HPI  Patient is a 26 year old male presenting with right foot pain after closing a door on it. Patient has no ecchymosis or breaks in the skin. Does have minimal swelling over the dorsum of the foot. Patient was able to ambulate to room. able to move all digits.  Past Medical History:  Diagnosis Date  . Finger fracture, left 06/2012   left small finger    There are no active problems to display for this patient.   Past Surgical History:  Procedure Laterality Date  . CLOSED REDUCTION FINGER WITH PERCUTANEOUS PINNING  12/15/2006   right thumb PIP fx. dislocation  . CLOSED REDUCTION FINGER WITH PERCUTANEOUS PINNING  06/15/2012   Procedure: CLOSED REDUCTION FINGER WITH PERCUTANEOUS PINNING;  Surgeon: Tami Ribas, MD;  Location: [facility];  Service: Orthopedics;  Laterality: Left;  left small finger       Home Medications    Prior to Admission medications   Medication Sig Start Date End Date Taking? Authorizing Provider  ibuprofen (ADVIL,MOTRIN) 800 MG tablet Take 1 tablet (800 mg total) by mouth 3 (three) times daily. 08/10/13   Marisa Severin, MD  oxyCODONE-acetaminophen (PERCOCET/ROXICET) 5-325 MG per tablet Take 1-2 tablets by mouth every 6 (six) hours as needed for severe pain. 08/10/13   Marisa Severin, MD    Family History History reviewed. No pertinent family history.  Social History Social History  Substance Use Topics  . Smoking status: Never Smoker  . Smokeless tobacco: Never Used  . Alcohol use Yes     Comment: just weekends     Allergies   Patient has no known allergies.   Review of Systems Review of Systems  Cardiovascular: Negative for chest pain.  Gastrointestinal: Negative for abdominal pain.     Physical  Exam Updated Vital Signs BP 131/74 (BP Location: Left Arm)   Pulse 79   Temp 98.6 F (37 C) (Oral)   Resp 16   Ht 5\' 9"  (1.753 m)   Wt 77.1 kg (170 lb)   SpO2 99%   BMI 25.10 kg/m   Physical Exam  Constitutional: He is oriented to person, place, and time. He appears well-nourished. No distress.  HENT:  Head: Normocephalic.  Eyes: Conjunctivae are normal.  Cardiovascular: Normal rate.   Pulmonary/Chest: Effort normal.  Musculoskeletal:  Right foot mild swelling to the dorsum, barely perceptive perceptible. Moved all toes. Good pulses. Good normal sensation.   Neurological: He is oriented to person, place, and time.  Skin: Skin is warm and dry. He is not diaphoretic.  Psychiatric: He has a normal mood and affect. His behavior is normal.     ED Treatments / Results  Labs (all labs ordered are listed, but only abnormal results are displayed) Labs Reviewed - No data to display  EKG  EKG Interpretation None       Radiology Dg Foot Complete Right  Result Date: 03/05/2017 CLINICAL DATA:  Right foot pain EXAM: RIGHT FOOT COMPLETE - 3+ VIEW COMPARISON:  None. FINDINGS: No fracture or dislocation is seen. The joint spaces are preserved. The visualized soft tissues are unremarkable. IMPRESSION: Negative. Electronically Signed   By: Charline Bills M.D.   On: 03/05/2017 23:55    Procedures Procedures (  including critical care time)  Medications Ordered in ED Medications - No data to display   Initial Impression / Assessment and Plan / ED Course  I have reviewed the triage vital signs and the nursing notes.  Pertinent labs & imaging results that were available during my care of the patient were reviewed by me and considered in my medical decision making (see chart for details).     Patient is a 26 year old male presenting with right foot pain after closing a door on it. Patient has no ecchymosis or breaks in the skin. Does have minimal swelling over the dorsum of the  foot. Patient was able to ambulate to room. able to move all digits.  Will treat with Ace bandage and crutches.  Final Clinical Impressions(s) / ED Diagnoses   Final diagnoses:  None    New Prescriptions New Prescriptions   No medications on file     Abelino Derrick, MD 03/06/17 [phone]

## 2017-03-06 NOTE — ED Notes (Signed)
Pt ambulated from door to bed with little difficulty

## 2017-03-06 NOTE — ED Notes (Signed)
Pt's foot wrapped in ace bandage and pt instructed on crutch use

## 2019-04-15 IMAGING — DX DG FOOT COMPLETE 3+V*R*
3 series · 3 of 3 positions shown · non-contrast
Comparison: None.

CLINICAL DATA: Right foot pain

EXAM:
RIGHT FOOT COMPLETE - 3+ VIEW

[foot ap]
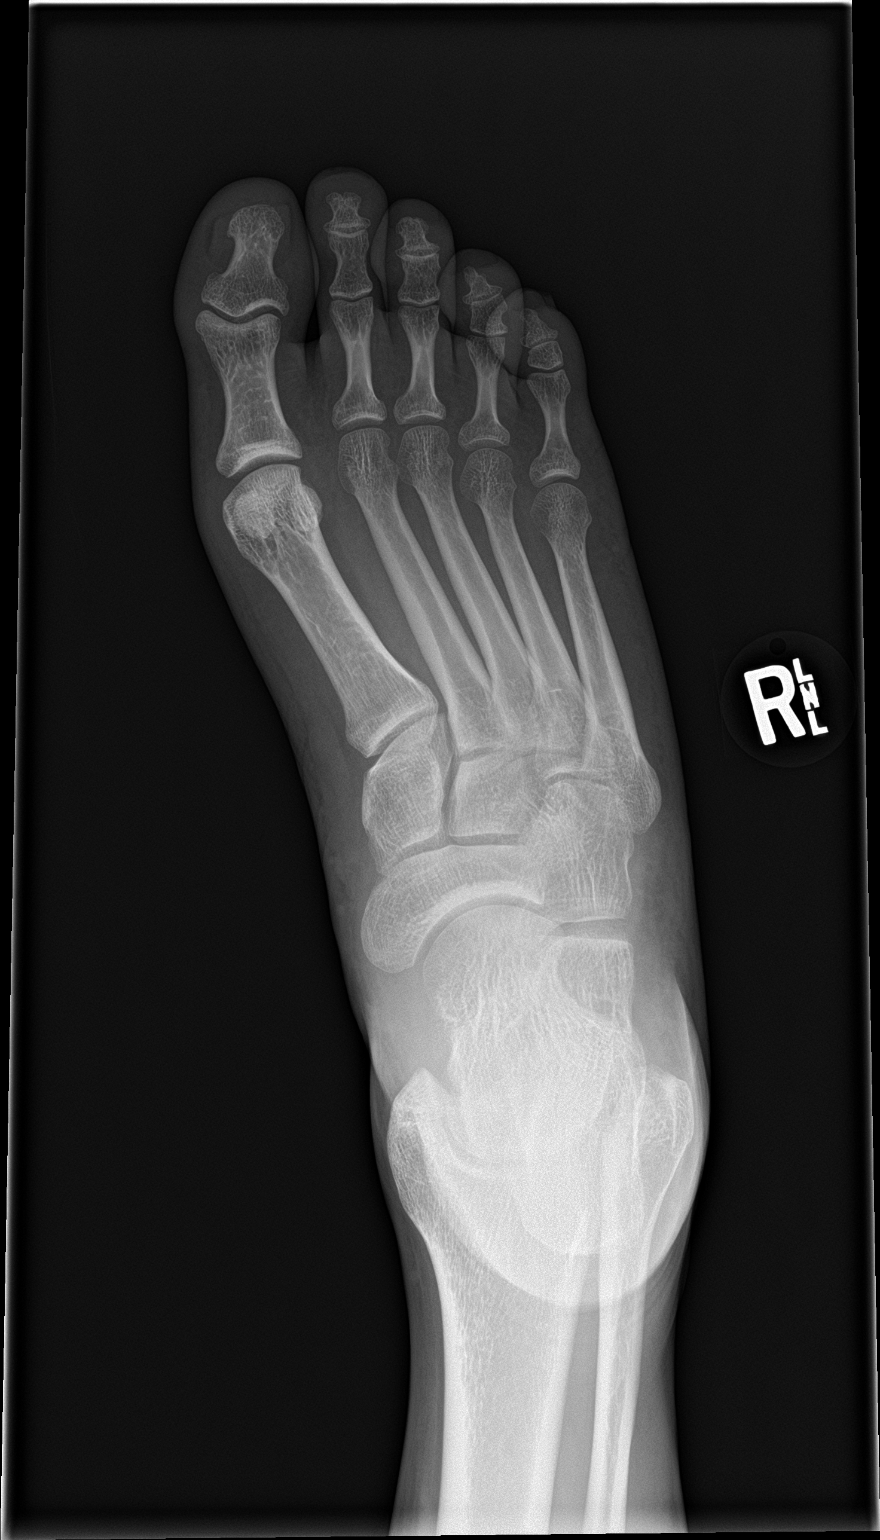

[foot obl]
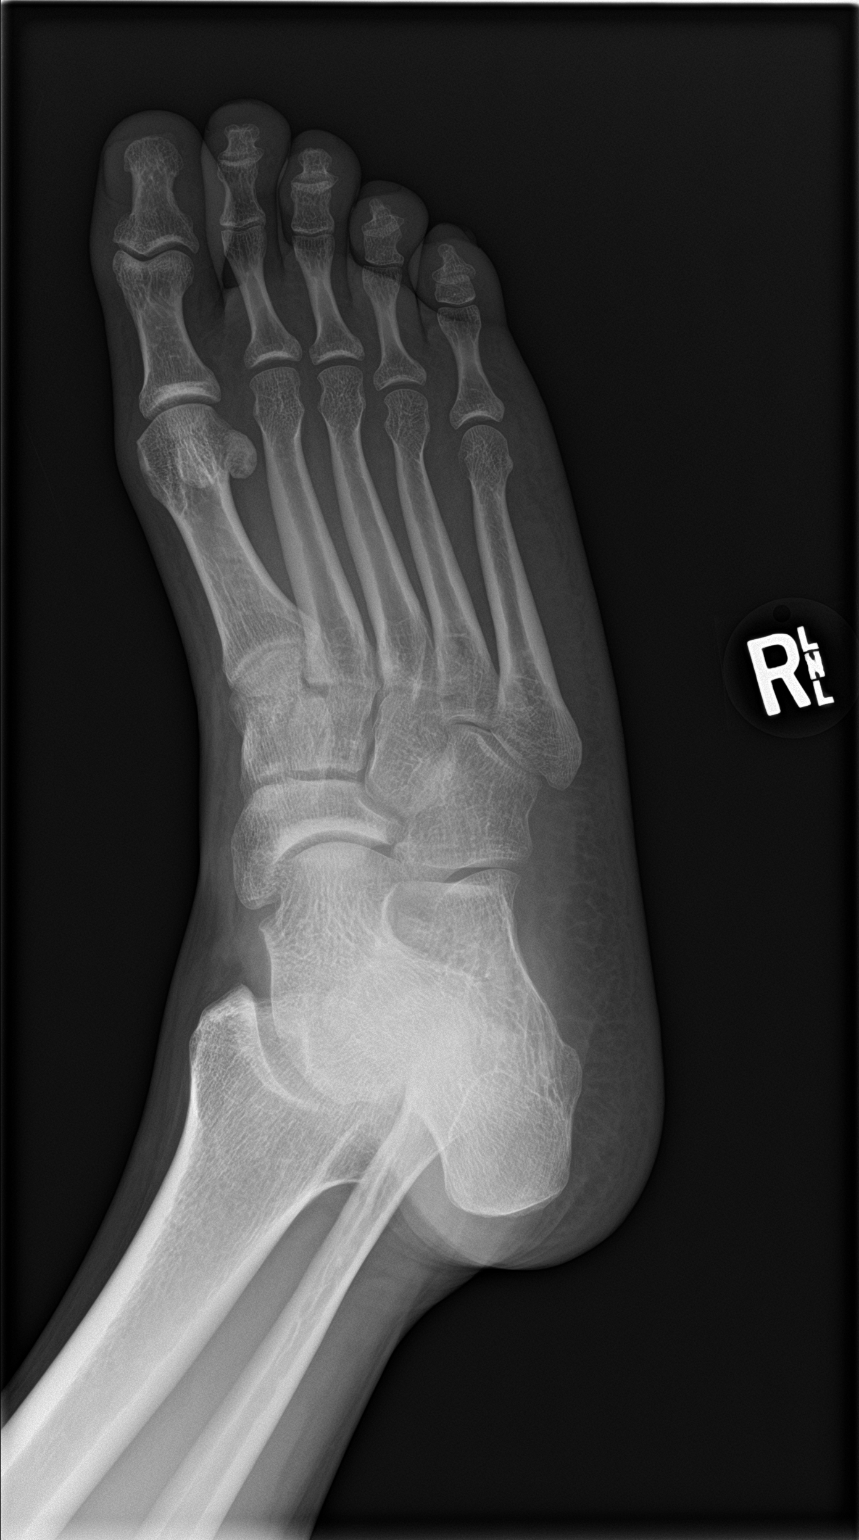

[foot lat]
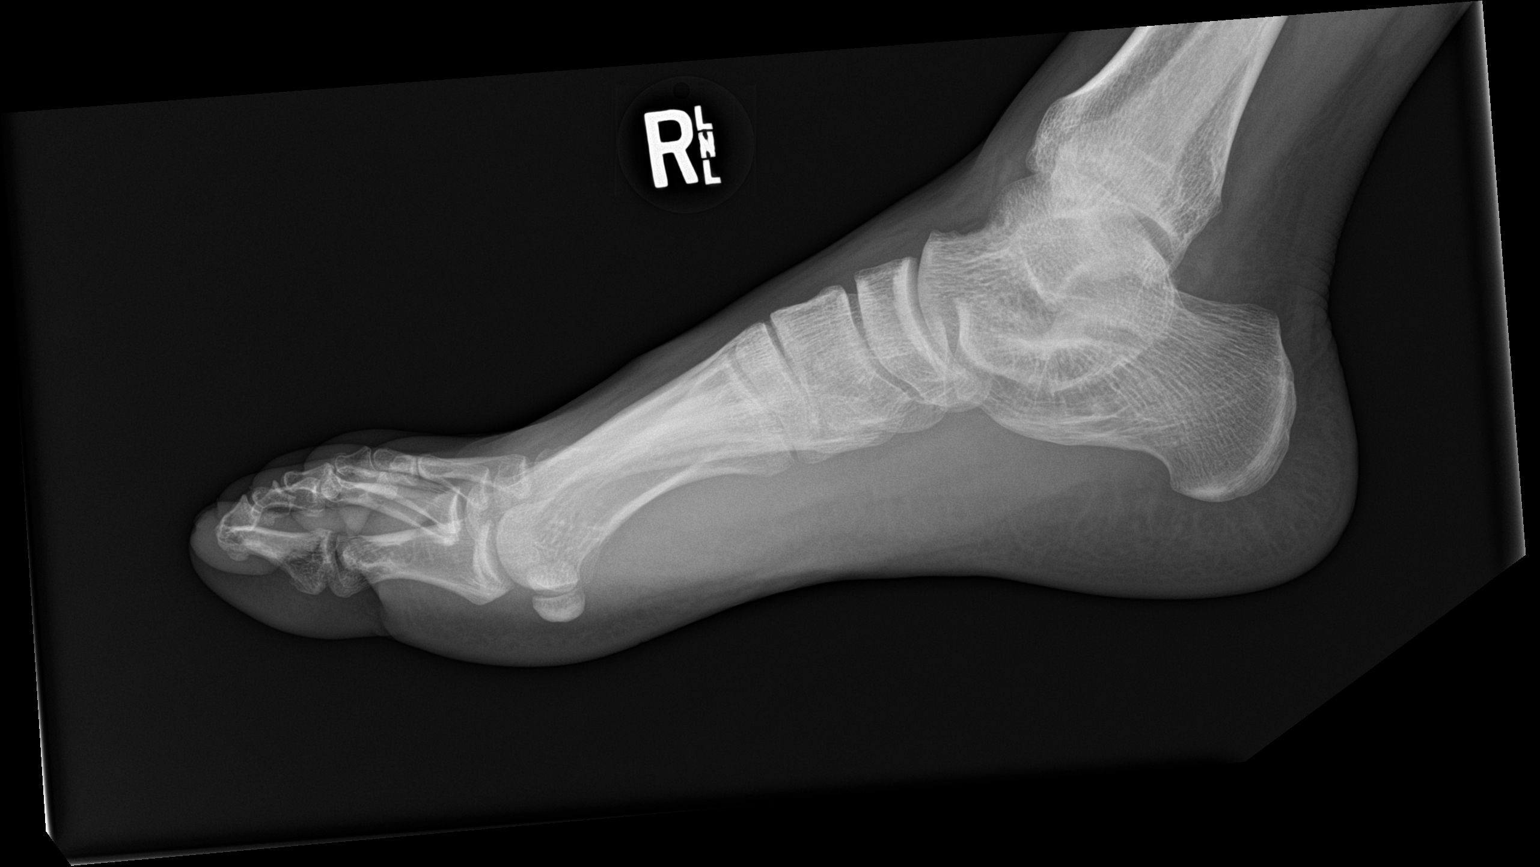

[3 of 3 positions shown; findings below may reference images not displayed]

FINDINGS: No fracture or dislocation is seen.

The joint spaces are preserved.

The visualized soft tissues are unremarkable.
IMPRESSION: Negative.

## 2019-12-02 ENCOUNTER — Other Ambulatory Visit: Payer: Self-pay

## 2019-12-02 ENCOUNTER — Ambulatory Visit (HOSPITAL_COMMUNITY)
Admission: EM | Admit: 2019-12-02 | Discharge: 2019-12-02 | Disposition: A | Discharge status: Home / Self Care | Payer: Self-pay | Attending: Emergency Medicine | Admitting: Emergency Medicine

## 2019-12-02 ENCOUNTER — Encounter (HOSPITAL_COMMUNITY): Payer: Self-pay

## 2019-12-02 DIAGNOSIS — Z113 Encounter for screening for infections with a predominantly sexual mode of transmission: Secondary | ICD-10-CM

## 2019-12-02 DIAGNOSIS — Z202 Contact with and (suspected) exposure to infections with a predominantly sexual mode of transmission: Secondary | ICD-10-CM

## 2019-12-02 MED ORDER — METRONIDAZOLE 500 MG PO TABS
ORAL_TABLET | ORAL | Status: AC
  Filled 2019-12-02: qty 4

## 2019-12-02 MED ORDER — METRONIDAZOLE 500 MG PO TABS
2000.0000 mg | ORAL_TABLET | Freq: Once | ORAL | Status: AC
  Administered 2019-12-02: 2000 mg via ORAL
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
## 2019-12-02 NOTE — Discharge Instructions (Addendum)
Penile self swab was obtained Prescribed metronidazole 2000 mg was given in office (do not take while consuming alcohol) Take medications as prescribed and to completion We will follow up with you regarding the results of your test If tests are positive, please abstain from sexual activity until you and your partner(s) are treated Follow up with PCP or [facility] if symptoms persists Return here or go to ER if you have any new or worsening symptoms

## 2019-12-02 NOTE — ED Provider Notes (Signed)
[facility]   [id number] 12/02/19 Arrival Time: 1108   CC: STD exposure  SUBJECTIVE:  Darrell Dougherty is a 29 y.o. male who presents requesting STI screening.  Currently asymptomatic.  Partner symptomatic and diagnosed with trichomonas.  Last unprotected sexual encounter was a week ago.  Sexually active with 1 male partner.  Denies similar symptoms in the past.  Denies fever, chills, nausea, vomiting, abdominal or pelvic pain, urinary symptoms,  dyspareunia, penile  rashes or lesions.        No LMP for male patient.  ROS: As per HPI.  All other pertinent ROS negative.     Past Medical History:  Diagnosis Date  . Finger fracture, left 06/2012   left small finger   Past Surgical History:  Procedure Laterality Date  . CLOSED REDUCTION FINGER WITH PERCUTANEOUS PINNING  12/15/2006   right thumb PIP fx. dislocation  . CLOSED REDUCTION FINGER WITH PERCUTANEOUS PINNING  06/15/2012   Procedure: CLOSED REDUCTION FINGER WITH PERCUTANEOUS PINNING;  Surgeon: Tami Ribas, MD;  Location: [facility];  Service: Orthopedics;  Laterality: Left;  left small finger   No Known Allergies No current facility-administered medications on file prior to encounter.   Current Outpatient Medications on File Prior to Encounter  Medication Sig Dispense Refill  . ibuprofen (ADVIL,MOTRIN) 800 MG tablet Take 1 tablet (800 mg total) by mouth 3 (three) times daily. 21 tablet 0  . oxyCODONE-acetaminophen (PERCOCET/ROXICET) 5-325 MG per tablet Take 1-2 tablets by mouth every 6 (six) hours as needed for severe pain. 15 tablet 0   Social History   Socioeconomic History  . Marital status: Single    Spouse name: Not on file  . Number of children: Not on file  . Years of education: Not on file  . Highest education level: Not on file  Occupational History  . Not on file  Tobacco Use  . Smoking status: Never Smoker  . Smokeless tobacco: Never Used  Substance and Sexual Activity  .  Alcohol use: Yes    Comment: just weekends  . Drug use: Yes    Types: Marijuana    Comment: every day  . Sexual activity: Yes  Other Topics Concern  . Not on file  Social History Narrative  . Not on file   Social Determinants of Health   Financial Resource Strain:   . Difficulty of Paying Living Expenses:   Food Insecurity:   . Worried About Programme researcher, broadcasting/film/video in the Last Year:   . Barista in the Last Year:   Transportation Needs:   . Freight forwarder (Medical):   Marland Kitchen Lack of Transportation (Non-Medical):   Physical Activity:   . Days of Exercise per Week:   . Minutes of Exercise per Session:   Stress:   . Feeling of Stress :   Social Connections:   . Frequency of Communication with Friends and Family:   . Frequency of Social Gatherings with Friends and Family:   . Attends Religious Services:   . Active Member of Clubs or Organizations:   . Attends Banker Meetings:   Marland Kitchen Marital Status:   Intimate Partner Violence:   . Fear of Current or Ex-Partner:   . Emotionally Abused:   Marland Kitchen Physically Abused:   . Sexually Abused:    History reviewed. No pertinent family history.  OBJECTIVE:  Vitals:   12/02/19 1157  BP: (!) 119/96  Pulse: 70  Resp: 16  Temp: 97.9  F (36.6 C)  TempSrc: Oral  SpO2: 100%     General appearance: alert, NAD, appears stated age Head: NCAT Throat: lips, mucosa, and tongue normal; teeth and gums normal Lungs: CTA bilaterally without adventitious breath sounds Heart: regular rate and rhythm.  Radial pulses 2+ symmetrical bilaterally Back: no CVA tenderness Abdomen: soft, non-tender; bowel sounds normal; no masses or organomegaly; no guarding or rebound tenderness GU: deferred Skin: warm and dry Psychological:  Alert and cooperative. Normal mood and affect.  LABS:  Results for orders placed or performed during the hospital encounter of 06/15/12  Hemoglobin-hemacue, POC  Result Value Ref Range   Hemoglobin 13.7  13.0 - 17.0 g/dL    Labs Reviewed - No data to display  ASSESSMENT & PLAN:  1. Trichomonas contact, treated   2. Screen for STD (sexually transmitted disease)     Meds ordered this encounter  Medications  . metroNIDAZOLE (FLAGYL) tablet 2,000 mg    Pending: Labs Reviewed - No data to display  Discharge instructions Penile self swab was obtained Prescribed metronidazole 2000 mg was given in office (do not take while consuming alcohol) Take medications as prescribed and to completion We will follow up with you regarding the results of your test If tests are positive, please abstain from sexual activity until you and your partner(s) are treated Follow up with PCP or [facility] if symptoms persists Return here or go to ER if you have any new or worsening symptoms    Reviewed expectations re: course of current medical issues. Questions answered. Outlined signs and symptoms indicating need for more acute intervention. Patient verbalized understanding. After Visit Summary given.       Darrell Dougherty, Darrell Dougherty 12/02/19 1218

## 2019-12-05 LAB — CYTOLOGY, (ORAL, ANAL, URETHRAL) ANCILLARY ONLY
Chlamydia: NEGATIVE
Comment: NEGATIVE
Comment: NEGATIVE
Comment: NORMAL
Neisseria Gonorrhea: NEGATIVE
Trichomonas: NEGATIVE
